# Patient Record
Sex: Female | Born: 1960 | Race: White | Hispanic: No | Marital: Single | Smoking: Never smoker
Health system: Southern US, Community
[De-identification: ages and names within clinical notes are randomized; demographics above are authoritative.]

## PROBLEM LIST (undated history)

## (undated) DIAGNOSIS — M810 Age-related osteoporosis without current pathological fracture: Secondary | ICD-10-CM

## (undated) DIAGNOSIS — I10 Essential (primary) hypertension: Secondary | ICD-10-CM

## (undated) DIAGNOSIS — G809 Cerebral palsy, unspecified: Secondary | ICD-10-CM

---

## 2001-07-20 ENCOUNTER — Encounter: Payer: Self-pay | Admitting: *Deleted

## 2001-07-20 ENCOUNTER — Encounter: Admission: RE | Admit: 2001-07-20 | Discharge: 2001-07-20 | Payer: Self-pay | Admitting: *Deleted

## 2002-12-26 ENCOUNTER — Encounter: Admission: RE | Admit: 2002-12-26 | Discharge: 2002-12-26 | Payer: Self-pay | Admitting: *Deleted

## 2003-01-02 ENCOUNTER — Other Ambulatory Visit: Admission: RE | Admit: 2003-01-02 | Discharge: 2003-01-02 | Payer: Self-pay | Admitting: *Deleted

## 2005-01-04 ENCOUNTER — Other Ambulatory Visit: Admission: RE | Admit: 2005-01-04 | Discharge: 2005-01-04 | Payer: Self-pay | Admitting: *Deleted

## 2005-01-10 ENCOUNTER — Encounter: Admission: RE | Admit: 2005-01-10 | Discharge: 2005-01-10 | Payer: Self-pay | Admitting: *Deleted

## 2006-09-25 ENCOUNTER — Encounter: Admission: RE | Admit: 2006-09-25 | Discharge: 2006-09-25 | Payer: Self-pay | Admitting: Family Medicine

## 2007-01-22 ENCOUNTER — Other Ambulatory Visit: Admission: RE | Admit: 2007-01-22 | Discharge: 2007-01-22 | Payer: Self-pay | Admitting: *Deleted

## 2007-08-30 ENCOUNTER — Encounter: Admission: RE | Admit: 2007-08-30 | Discharge: 2007-08-30 | Payer: Self-pay | Admitting: Family Medicine

## 2010-03-20 ENCOUNTER — Encounter: Payer: Self-pay | Admitting: *Deleted

## 2010-03-21 ENCOUNTER — Encounter: Payer: Self-pay | Admitting: Orthopaedic Surgery

## 2012-01-20 ENCOUNTER — Other Ambulatory Visit: Payer: Self-pay | Admitting: Gastroenterology

## 2012-01-20 DIAGNOSIS — Z139 Encounter for screening, unspecified: Secondary | ICD-10-CM

## 2012-02-09 ENCOUNTER — Ambulatory Visit
Admission: RE | Admit: 2012-02-09 | Discharge: 2012-02-09 | Disposition: A | Discharge status: Home / Self Care | Payer: PRIVATE HEALTH INSURANCE | Source: Ambulatory Visit | Attending: Gastroenterology | Admitting: Gastroenterology

## 2012-02-09 ENCOUNTER — Other Ambulatory Visit: Payer: Self-pay | Admitting: Gastroenterology

## 2012-02-09 DIAGNOSIS — Z139 Encounter for screening, unspecified: Secondary | ICD-10-CM

## 2012-03-06 ENCOUNTER — Other Ambulatory Visit: Payer: Self-pay | Admitting: Obstetrics and Gynecology

## 2012-03-06 ENCOUNTER — Other Ambulatory Visit (HOSPITAL_COMMUNITY)
Admission: RE | Admit: 2012-03-06 | Discharge: 2012-03-06 | Disposition: A | Discharge status: Home / Self Care | Payer: PRIVATE HEALTH INSURANCE | Source: Ambulatory Visit | Attending: Obstetrics and Gynecology | Admitting: Obstetrics and Gynecology

## 2012-03-06 DIAGNOSIS — Z01419 Encounter for gynecological examination (general) (routine) without abnormal findings: Secondary | ICD-10-CM | POA: Insufficient documentation

## 2012-03-19 ENCOUNTER — Other Ambulatory Visit: Payer: Self-pay | Admitting: Obstetrics and Gynecology

## 2012-03-19 DIAGNOSIS — R1900 Intra-abdominal and pelvic swelling, mass and lump, unspecified site: Secondary | ICD-10-CM

## 2012-03-22 ENCOUNTER — Ambulatory Visit
Admission: RE | Admit: 2012-03-22 | Discharge: 2012-03-22 | Disposition: A | Discharge status: Home / Self Care | Payer: PRIVATE HEALTH INSURANCE | Source: Ambulatory Visit | Attending: Obstetrics and Gynecology | Admitting: Obstetrics and Gynecology

## 2012-03-22 DIAGNOSIS — R1900 Intra-abdominal and pelvic swelling, mass and lump, unspecified site: Secondary | ICD-10-CM

## 2012-03-22 MED ORDER — IOHEXOL 300 MG/ML  SOLN
75.0000 mL | Freq: Once | INTRAMUSCULAR | Status: AC | PRN
  Administered 2012-03-22: 75 mL via INTRAVENOUS

## 2013-03-03 ENCOUNTER — Emergency Department (HOSPITAL_COMMUNITY)
Admission: EM | Admit: 2013-03-03 | Discharge: 2013-03-03 | Disposition: A | Discharge status: Home / Self Care | Payer: PRIVATE HEALTH INSURANCE | Attending: Emergency Medicine | Admitting: Emergency Medicine

## 2013-03-03 ENCOUNTER — Encounter (HOSPITAL_COMMUNITY): Payer: Self-pay | Admitting: Emergency Medicine

## 2013-03-03 DIAGNOSIS — IMO0002 Reserved for concepts with insufficient information to code with codable children: Secondary | ICD-10-CM | POA: Insufficient documentation

## 2013-03-03 DIAGNOSIS — W19XXXA Unspecified fall, initial encounter: Secondary | ICD-10-CM

## 2013-03-03 DIAGNOSIS — Y92410 Unspecified street and highway as the place of occurrence of the external cause: Secondary | ICD-10-CM | POA: Insufficient documentation

## 2013-03-03 DIAGNOSIS — Z79899 Other long term (current) drug therapy: Secondary | ICD-10-CM | POA: Insufficient documentation

## 2013-03-03 DIAGNOSIS — R296 Repeated falls: Secondary | ICD-10-CM | POA: Insufficient documentation

## 2013-03-03 DIAGNOSIS — M810 Age-related osteoporosis without current pathological fracture: Secondary | ICD-10-CM | POA: Insufficient documentation

## 2013-03-03 DIAGNOSIS — S0001XA Abrasion of scalp, initial encounter: Secondary | ICD-10-CM

## 2013-03-03 DIAGNOSIS — I10 Essential (primary) hypertension: Secondary | ICD-10-CM | POA: Insufficient documentation

## 2013-03-03 DIAGNOSIS — Y9389 Activity, other specified: Secondary | ICD-10-CM | POA: Insufficient documentation

## 2013-03-03 DIAGNOSIS — G809 Cerebral palsy, unspecified: Secondary | ICD-10-CM | POA: Insufficient documentation

## 2013-03-03 HISTORY — DX: Cerebral palsy, unspecified: G80.9

## 2013-03-03 HISTORY — DX: Age-related osteoporosis without current pathological fracture: M81.0

## 2013-03-03 HISTORY — DX: Essential (primary) hypertension: I10

## 2013-03-03 MED ORDER — TETANUS-DIPHTH-ACELL PERTUSSIS 5-2.5-18.5 LF-MCG/0.5 IM SUSP
0.5000 mL | Freq: Once | INTRAMUSCULAR | Status: AC
  Administered 2013-03-03: 0.5 mL via INTRAMUSCULAR
  Filled 2013-03-03: qty 0.5

## 2013-03-03 NOTE — ED Provider Notes (Signed)
CSN: [id number]     Arrival date & time 03/03/13  1016 History  This chart was scribed for Santiago Glad, PA working with Rolan Bucco, MD by Quintella Reichert, ED Scribe. This patient was seen in room TR10C/TR10C and the patient's care was started at 11:51 AM.   Chief Complaint  Patient presents with  . Fall  . Head Laceration    The history is provided by the patient and a relative. No language interpreter was used.    HPI Comments: Brianna Hicks is a 53 y.o. female with h/o cerebral palsy and osteoporosis who presents to the Emergency Department complaining of a head laceration sustained in a fall pta.  Pt was getting out of a car when her aunt accidentally opened the car door into her and pt fell and hit her head on concrete.  She denies LOC.  She is not on blood-thinners.  Pt now presents with a small wound to the top of her scalp.  Bleeding is controlled and she denies pain to the area.  She denies pain to any other areas including arms, legs, chest, back, neck, or abdomen.  She denies nausea, vomiting, visual changes, or any other associated symptoms.  She is unsure of her last tetanus vaccination.   Past Medical History  Diagnosis Date  . Cerebral palsy   . Hypertension   . Osteoporosis     History reviewed. No pertinent past surgical history.  History reviewed. No pertinent family history.   History  Substance Use Topics  . Smoking status: Not on file  . Smokeless tobacco: Not on file  . Alcohol Use: No    OB History   Grav Para Term Preterm Abortions TAB SAB Ect Mult Living                  Review of Systems  Eyes: Negative for visual disturbance.  Cardiovascular: Negative for chest pain.  Gastrointestinal: Negative for nausea, vomiting and abdominal pain.  Musculoskeletal: Negative for back pain and neck pain.  Skin: Positive for wound (small wound to top of head).  Neurological: Negative for syncope and headaches.     Allergies  Review of patient's  allergies indicates no known allergies.  Home Medications   Current Outpatient Rx  Name  Route  Sig  Dispense  Refill  . diazepam (VALIUM) 5 MG tablet   Oral   Take 5 mg by mouth every 6 (six) hours as needed for anxiety.         . sertraline (ZOLOFT) 100 MG tablet   Oral   Take 200 mg by mouth daily.         . triazolam (HALCION) 0.125 MG tablet   Oral   Take 0.125 mg by mouth at bedtime as needed for sleep.          BP 139/98  Pulse 86  Resp 18  SpO2 97%  Physical Exam  Nursing note and vitals reviewed. Constitutional: She appears well-developed and well-nourished.  HENT:  Head: Normocephalic.  Mouth/Throat: Oropharynx is clear and moist.  1-cm superficial abrasion of left frontal scalp, no active bleeding, no laceration  Eyes: EOM are normal. Pupils are equal, round, and reactive to light.  Neck: Normal range of motion. Neck supple.  Cardiovascular: Normal rate, regular rhythm and normal heart sounds.   Pulmonary/Chest: Effort normal and breath sounds normal. She has no wheezes.  Musculoskeletal: Normal range of motion.       Cervical back: She exhibits no bony  tenderness.       Thoracic back: She exhibits no bony tenderness.       Lumbar back: She exhibits no bony tenderness.  No spinal tenderness  Neurological: She is alert. She has normal strength. No cranial nerve deficit or sensory deficit.  Skin: Skin is warm and dry.  Psychiatric: She has a normal mood and affect. Her behavior is normal.    ED Course  Procedures (including critical care time)  DIAGNOSTIC STUDIES: Oxygen Saturation is 97% on room air, normal by my interpretation.    COORDINATION OF CARE: 11:58 AM-Informed pt and family that her wound does not require suturing.  Discussed treatment plan which includes tetanus booster and wound care.  Advised return precautions.  Pt and family expressed understanding and agreed with plan.   Labs Review Labs Reviewed - No data to display  Imaging  Review No results found.  EKG Interpretation   None       MDM  No diagnosis found. Patient presents today after a fall and an abrasion of the scalp.  No LOC.  Patient is currently not on any anticoagulants.  Normal neurological exam.  Therefore, do not feel that an imaging is indicated at this time.  Tetanus updated.  Patient stable for discharge.  Return precautions given.   I personally performed the services described in this documentation, which was scribed in my presence. The recorded information has been reviewed and is accurate.    Santiago GladHeather Darcy Cordner, PA-C 03/05/13 2223

## 2013-03-03 NOTE — ED Notes (Signed)
PA at bedside.

## 2013-03-03 NOTE — ED Notes (Signed)
Pt was getting out of car, the door closed and hit her, knocked her over and has small laceration to top of head, bleeding controlled. No loc, no blood thinners and unknown tetanus.

## 2013-03-05 NOTE — ED Provider Notes (Signed)
Medical screening examination/treatment/procedure(s) were performed by non-physician practitioner and as supervising physician I was immediately available for consultation/collaboration.  EKG Interpretation   None         Arnitra Sokoloski, MD 03/05/13 2302 

## 2013-12-27 ENCOUNTER — Ambulatory Visit
Admission: RE | Admit: 2013-12-27 | Discharge: 2013-12-27 | Disposition: A | Discharge status: Home / Self Care | Payer: Medicare Other | Source: Ambulatory Visit | Attending: Family Medicine | Admitting: Family Medicine

## 2013-12-27 ENCOUNTER — Other Ambulatory Visit: Payer: Self-pay | Admitting: Family Medicine

## 2013-12-27 DIAGNOSIS — M899 Disorder of bone, unspecified: Secondary | ICD-10-CM

## 2014-08-09 IMAGING — CR DG ABDOMEN 1V
1 series · 1 of 1 positions shown · non-contrast
Comparison: No priors.

CLINICAL DATA: Screening examination.  Patient unable to tolerate a
double contrast barium enema.

ABDOMEN - 1 VIEW
TECHNIQUE: After obtaining a scout radiograph air-contrast barium
enema was performed under fluoroscopy using high-density barium.

[view not recorded]
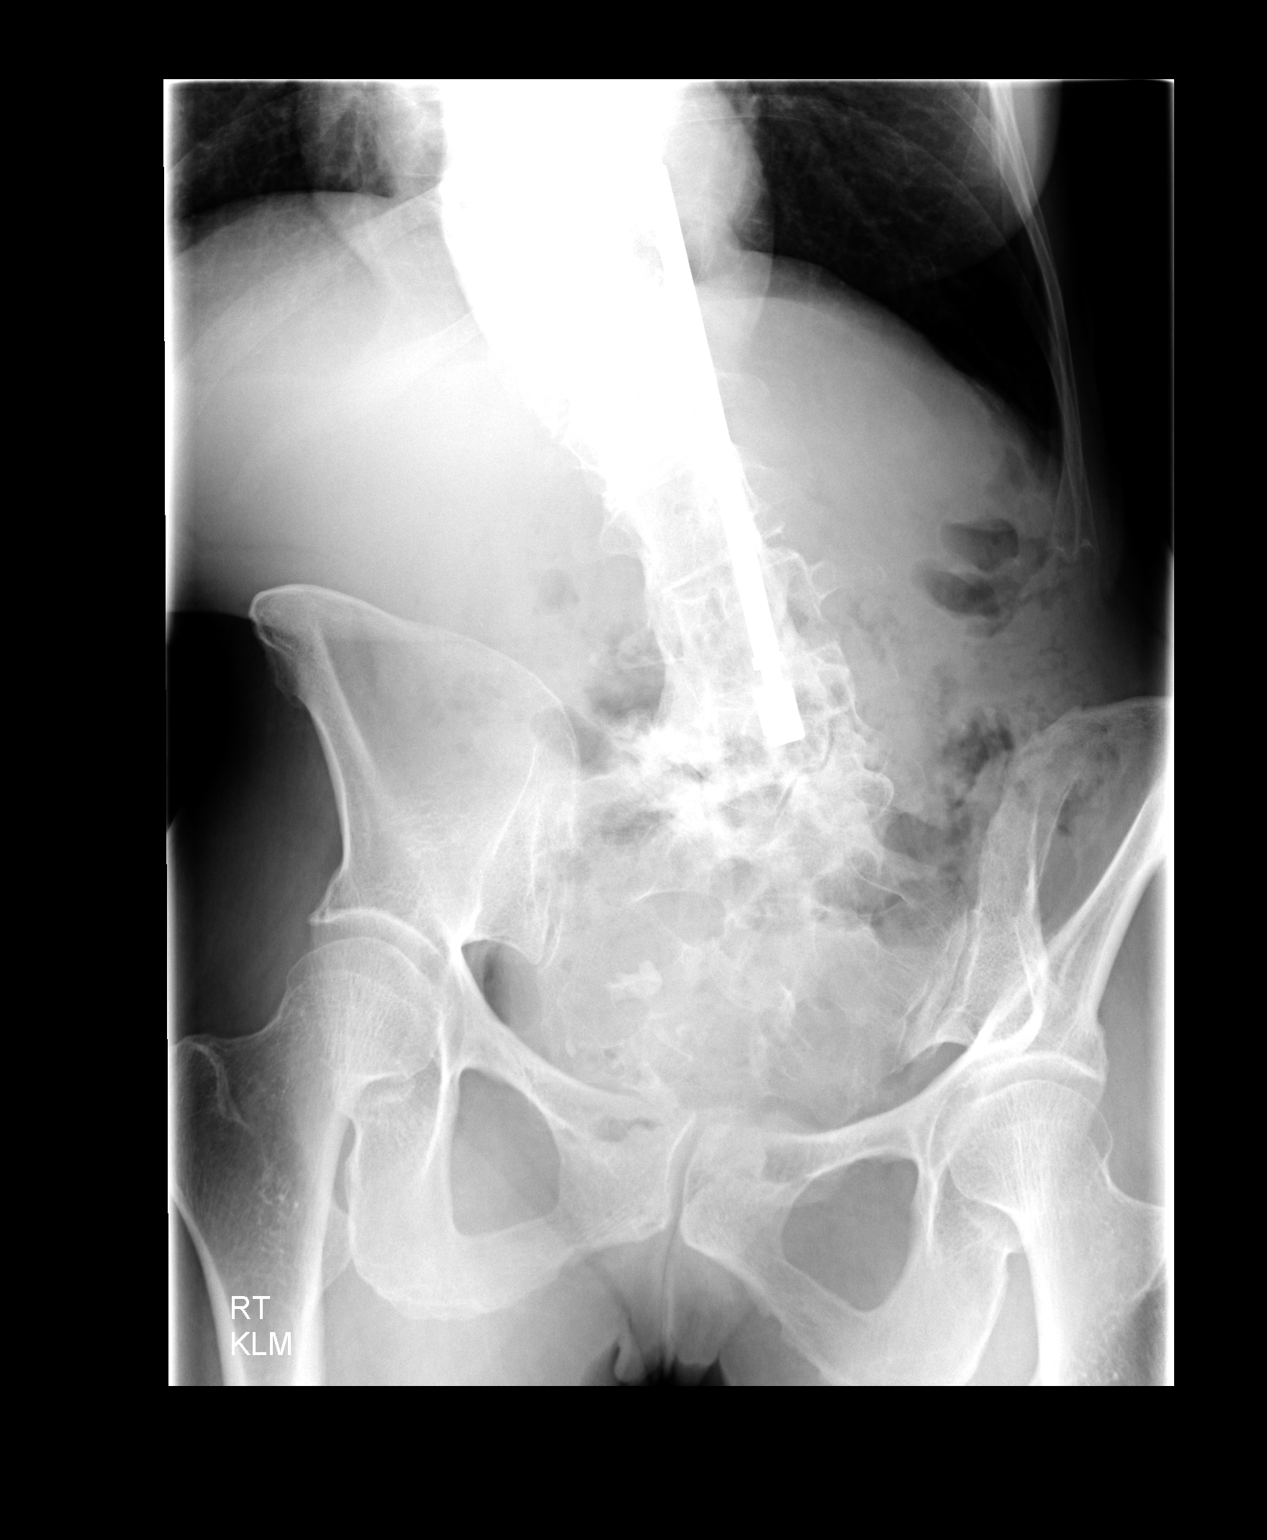

[1 of 1 positions shown; findings below may reference images not displayed]

FINDINGS: Single view of the abdomen demonstrates some gas and
stool scattered throughout the colon including the distal rectum.
No pathologic distension of small bowel.  No gross evidence of
pneumoperitoneum.  Severe S-shaped scoliosis of the thoracolumbar
spine convex to the right superiorly and to the left inferiorly is
noted, with spinal fixation rods (incompletely visualized).
IMPRESSION: 1.  Nonobstructive bowel gas pattern.  No pneumoperitoneum.
2.  Additional incidental findings, as above.

## 2019-09-30 ENCOUNTER — Ambulatory Visit (INDEPENDENT_AMBULATORY_CARE_PROVIDER_SITE_OTHER): Payer: Medicare Other | Admitting: Podiatry

## 2019-09-30 ENCOUNTER — Other Ambulatory Visit: Payer: Self-pay

## 2019-09-30 ENCOUNTER — Encounter: Payer: Self-pay | Admitting: Podiatry

## 2019-09-30 DIAGNOSIS — M216X9 Other acquired deformities of unspecified foot: Secondary | ICD-10-CM | POA: Diagnosis not present

## 2019-09-30 DIAGNOSIS — M2010 Hallux valgus (acquired), unspecified foot: Secondary | ICD-10-CM

## 2019-09-30 DIAGNOSIS — M2041 Other hammer toe(s) (acquired), right foot: Secondary | ICD-10-CM | POA: Insufficient documentation

## 2019-09-30 DIAGNOSIS — M2042 Other hammer toe(s) (acquired), left foot: Secondary | ICD-10-CM

## 2019-09-30 DIAGNOSIS — Q828 Other specified congenital malformations of skin: Secondary | ICD-10-CM

## 2019-09-30 NOTE — Progress Notes (Signed)
This patient presents the office with chief complaint of severely painful calluses on both feet.  Is brought to the office by a female caregiver.  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  This patient states that the corn on the fifth toe of the right foot has been extremely painful for 2 weeks and she presents the office for treatment.  She also says that she has multiple callus on the bottom of both of her forefeet which are painful when walking.  She presents the office today for an evaluation and treatment of her painful callus.  Vascular  Dorsalis pedis  are palpable  B/L. Posterior tibial pulses are absent  B/L. Capillary return  WNL.  Temperature gradient is  WNL.  Skin turgor  WNL  Sensorium  deferred  Nail Exam  Patient has normal nails with no evidence of bacterial or fungal infection.  Orthopedic  Exam  Muscle tone and muscle strength  WNL.  No limitations of motion feet  B/L.  No crepitus or joint effusion noted.  HAV  B/L.  Hammer toes  B/L.  Prominent metatarsal heads  B/L.  Plantarflexed fifth metatarsals  B/L.Marland Kitchen  Skin  No open lesions.  Normal skin texture and turgor. Porokeratosis sub 2 right foot.  Heloma durum fifth toe right foot.  Porokeratosis sub 5  B/L  Porokeratosis/callus  Forefeet  B/L.    IE>  Debride multiple callus  B/L with # 15 blade.  RTC 10 weeks.  Helane Gunther DPM

## 2019-10-09 ENCOUNTER — Ambulatory Visit: Payer: Medicare Other | Admitting: Orthotics

## 2019-10-09 ENCOUNTER — Other Ambulatory Visit: Payer: Self-pay

## 2019-10-09 DIAGNOSIS — M2041 Other hammer toe(s) (acquired), right foot: Secondary | ICD-10-CM

## 2019-10-09 DIAGNOSIS — M216X9 Other acquired deformities of unspecified foot: Secondary | ICD-10-CM

## 2019-10-09 DIAGNOSIS — Q828 Other specified congenital malformations of skin: Secondary | ICD-10-CM

## 2019-10-09 NOTE — Progress Notes (Signed)
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  Self pay $250

## 2019-10-10 ENCOUNTER — Telehealth: Payer: Self-pay | Admitting: Podiatry

## 2019-10-10 NOTE — Telephone Encounter (Signed)
Pts aunt Pollie Friar called and has discussed the shoes with the poa her nephew and he said it was ok to proceed with the shoes.

## 2019-10-21 ENCOUNTER — Telehealth: Payer: Self-pay | Admitting: Podiatry

## 2019-10-21 NOTE — Telephone Encounter (Signed)
Received voicemail from Pollie Friar per patient asking for status of shoes they ordered.   I returned call and told her the inserts are being made as we speak and they would call when they come in to schedule pt to pick them up.Marland KitchenMarland Kitchen

## 2019-11-11 ENCOUNTER — Telehealth: Payer: Self-pay | Admitting: Podiatry

## 2019-11-11 NOTE — Telephone Encounter (Signed)
Received voicemail from [facility] checking on pts shoes that were ordered.  They were sent to [place] office for pt to be called to schedule an appt.

## 2019-11-27 ENCOUNTER — Ambulatory Visit (INDEPENDENT_AMBULATORY_CARE_PROVIDER_SITE_OTHER): Payer: Medicare Other | Admitting: Orthotics

## 2019-11-27 ENCOUNTER — Other Ambulatory Visit: Payer: Self-pay

## 2019-11-27 DIAGNOSIS — M2041 Other hammer toe(s) (acquired), right foot: Secondary | ICD-10-CM | POA: Diagnosis not present

## 2019-11-27 DIAGNOSIS — M2042 Other hammer toe(s) (acquired), left foot: Secondary | ICD-10-CM

## 2019-11-27 DIAGNOSIS — Q828 Other specified congenital malformations of skin: Secondary | ICD-10-CM

## 2019-11-27 NOTE — Progress Notes (Signed)
Picked up [employer];

## 2019-12-19 ENCOUNTER — Encounter: Payer: Self-pay | Admitting: Podiatry

## 2019-12-19 ENCOUNTER — Other Ambulatory Visit: Payer: Self-pay

## 2019-12-19 ENCOUNTER — Ambulatory Visit (INDEPENDENT_AMBULATORY_CARE_PROVIDER_SITE_OTHER): Payer: Medicare Other | Admitting: Podiatry

## 2019-12-19 DIAGNOSIS — M216X9 Other acquired deformities of unspecified foot: Secondary | ICD-10-CM

## 2019-12-19 DIAGNOSIS — Q828 Other specified congenital malformations of skin: Secondary | ICD-10-CM

## 2019-12-19 DIAGNOSIS — M2010 Hallux valgus (acquired), unspecified foot: Secondary | ICD-10-CM | POA: Diagnosis not present

## 2019-12-19 NOTE — Progress Notes (Addendum)
This patient presents the office with chief complaint of severely painful calluses on both feet.  She  Is brought to the office by a female caregiver.  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    She also says that she has multiple callus on the bottom of both of her forefeet which are painful when walking.  She presents the office today for an evaluation and treatment of her painful callus. Patient has received her new shoes thrr weeks ago and her feet are better.  Vascular  Dorsalis pedis  are palpable  B/L. Posterior tibial pulses are absent  B/L. Capillary return  WNL.  Temperature gradient is  WNL.  Skin turgor  WNL  Sensorium  deferred  Nail Exam  Patient has normal nails with no evidence of bacterial or fungal infection.  Orthopedic  Exam  Muscle tone and muscle strength  WNL.  No limitations of motion feet  B/L.  No crepitus or joint effusion noted.  HAV  B/L.  Hammer toes  B/L.  Prominent metatarsal heads  B/L.  Plantarflexed fifth metatarsals  B/L.Marland Kitchen  Skin  No open lesions.  Normal skin texture and turgor. Porokeratosis sub 2 right foot.    Porokeratosis sub 5  B/L  Porokeratosis/callus  Forefeet  B/L.      Debride multiple callus  B/L with # 15 blade.  RTC 10 weeks.  Helane Gunther DPM

## 2020-02-27 ENCOUNTER — Encounter: Payer: Self-pay | Admitting: Podiatry

## 2020-02-27 ENCOUNTER — Ambulatory Visit (INDEPENDENT_AMBULATORY_CARE_PROVIDER_SITE_OTHER): Payer: Medicare Other | Admitting: Podiatry

## 2020-02-27 ENCOUNTER — Other Ambulatory Visit: Payer: Self-pay

## 2020-02-27 DIAGNOSIS — F39 Unspecified mood [affective] disorder: Secondary | ICD-10-CM | POA: Insufficient documentation

## 2020-02-27 DIAGNOSIS — L84 Corns and callosities: Secondary | ICD-10-CM

## 2020-02-27 DIAGNOSIS — M2042 Other hammer toe(s) (acquired), left foot: Secondary | ICD-10-CM

## 2020-02-27 DIAGNOSIS — M2041 Other hammer toe(s) (acquired), right foot: Secondary | ICD-10-CM | POA: Diagnosis not present

## 2020-02-27 DIAGNOSIS — M810 Age-related osteoporosis without current pathological fracture: Secondary | ICD-10-CM | POA: Insufficient documentation

## 2020-02-27 DIAGNOSIS — K589 Irritable bowel syndrome without diarrhea: Secondary | ICD-10-CM | POA: Insufficient documentation

## 2020-02-27 DIAGNOSIS — G809 Cerebral palsy, unspecified: Secondary | ICD-10-CM | POA: Diagnosis not present

## 2020-02-27 DIAGNOSIS — I10 Essential (primary) hypertension: Secondary | ICD-10-CM | POA: Insufficient documentation

## 2020-02-27 DIAGNOSIS — Q828 Other specified congenital malformations of skin: Secondary | ICD-10-CM

## 2020-02-27 DIAGNOSIS — M549 Dorsalgia, unspecified: Secondary | ICD-10-CM | POA: Insufficient documentation

## 2020-02-27 DIAGNOSIS — K5900 Constipation, unspecified: Secondary | ICD-10-CM | POA: Insufficient documentation

## 2020-02-27 DIAGNOSIS — M419 Scoliosis, unspecified: Secondary | ICD-10-CM | POA: Insufficient documentation

## 2020-02-27 DIAGNOSIS — G8929 Other chronic pain: Secondary | ICD-10-CM | POA: Insufficient documentation

## 2020-02-27 NOTE — Progress Notes (Signed)
This patient presents the office with chief complaint of severely painful calluses on both feet.  She  Is brought to the office by a female caregiver.  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    She also says that she has multiple callus on the bottom of both of her forefeet which are painful when walking.  She presents the office today for an evaluation and treatment of her painful callus.   Vascular  Dorsalis pedis  are palpable  B/L. Posterior tibial pulses are absent  B/L. Capillary return  WNL.  Temperature gradient is  WNL.  Skin turgor  WNL  Sensorium  deferred  Nail Exam  Patient has normal nails with no evidence of bacterial or fungal infection.  Orthopedic  Exam  Muscle tone and muscle strength  WNL.  No limitations of motion feet  B/L.  No crepitus or joint effusion noted.  HAV  B/L.  Hammer toes  B/L.  Prominent metatarsal heads  B/L.  Plantarflexed fifth metatarsals  B/L.Marland Kitchen  Skin  No open lesions.  Normal skin texture and turgor. Heloma durum fifth toe right foot.  Porokeratosis sub 5th  Right.   Porokeratosis sub 3 right  Porokeratosis/callus  Forefeet  B/L.      Debride multiple callus  B/L with # 15 blade.  RTC 10 weeks.  Helane Gunther DPM

## 2020-05-07 ENCOUNTER — Ambulatory Visit (INDEPENDENT_AMBULATORY_CARE_PROVIDER_SITE_OTHER): Payer: Medicare Other | Admitting: Podiatry

## 2020-05-07 ENCOUNTER — Encounter: Payer: Self-pay | Admitting: Podiatry

## 2020-05-07 ENCOUNTER — Other Ambulatory Visit: Payer: Self-pay

## 2020-05-07 DIAGNOSIS — L84 Corns and callosities: Secondary | ICD-10-CM | POA: Diagnosis not present

## 2020-05-07 DIAGNOSIS — M2041 Other hammer toe(s) (acquired), right foot: Secondary | ICD-10-CM | POA: Diagnosis not present

## 2020-05-07 DIAGNOSIS — G809 Cerebral palsy, unspecified: Secondary | ICD-10-CM | POA: Diagnosis not present

## 2020-05-07 DIAGNOSIS — M2010 Hallux valgus (acquired), unspecified foot: Secondary | ICD-10-CM

## 2020-05-07 DIAGNOSIS — Q828 Other specified congenital malformations of skin: Secondary | ICD-10-CM | POA: Diagnosis not present

## 2020-05-07 DIAGNOSIS — M216X9 Other acquired deformities of unspecified foot: Secondary | ICD-10-CM

## 2020-05-07 DIAGNOSIS — M2042 Other hammer toe(s) (acquired), left foot: Secondary | ICD-10-CM

## 2020-05-07 NOTE — Progress Notes (Signed)
This patient presents the office with chief complaint of severely painful calluses on both feet.  She  Is brought to the office by a female caregiver.  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    She also says that she has multiple callus on the bottom of both of her forefeet which are painful when walking.  She presents the office today for an evaluation and treatment of her painful callus. She presents to the office with her guardian.  Vascular  Dorsalis pedis  are palpable  B/L. Posterior tibial pulses are absent  B/L. Capillary return  WNL.  Temperature gradient is  WNL.  Skin turgor  WNL  Sensorium  deferred  Nail Exam  Patient has normal nails with no evidence of bacterial or fungal infection.  Orthopedic  Exam  Muscle tone and muscle strength  WNL.  No limitations of motion feet  B/L.  No crepitus or joint effusion noted.  HAV  B/L.  Hammer toes  B/L.  Prominent metatarsal heads  B/L.  Plantarflexed fifth metatarsals  B/L.Marland Kitchen  Skin  No open lesions.  Normal skin texture and turgor. Heloma durum fifth toe right foot.  Porokeratosis sub 5th  left .   Porokeratosis sub 3 right  Porokeratosis/callus  Forefeet  B/L.      Debride multiple callus  B/L with # 15 blade.  RTC 10 weeks.  Helane Gunther DPM

## 2020-07-16 ENCOUNTER — Ambulatory Visit: Payer: Medicare Other | Admitting: Podiatry

## 2020-08-03 ENCOUNTER — Ambulatory Visit: Payer: Medicare Other | Admitting: Podiatry

## 2020-08-17 ENCOUNTER — Ambulatory Visit: Payer: Medicare Other | Admitting: Podiatry

## 2022-01-24 ENCOUNTER — Emergency Department (HOSPITAL_COMMUNITY)
Admission: EM | Admit: 2022-01-24 | Discharge: 2022-01-24 | Disposition: A | Discharge status: Home / Self Care | Payer: Medicare Other | Attending: Emergency Medicine | Admitting: Emergency Medicine

## 2022-01-24 ENCOUNTER — Encounter (HOSPITAL_COMMUNITY): Payer: Self-pay

## 2022-01-24 ENCOUNTER — Other Ambulatory Visit: Payer: Self-pay

## 2022-01-24 DIAGNOSIS — I10 Essential (primary) hypertension: Secondary | ICD-10-CM | POA: Diagnosis not present

## 2022-01-24 DIAGNOSIS — R531 Weakness: Secondary | ICD-10-CM

## 2022-01-24 DIAGNOSIS — Z79899 Other long term (current) drug therapy: Secondary | ICD-10-CM | POA: Insufficient documentation

## 2022-01-24 DIAGNOSIS — D649 Anemia, unspecified: Secondary | ICD-10-CM | POA: Diagnosis not present

## 2022-01-24 LAB — COMPREHENSIVE METABOLIC PANEL
ALT: 13 U/L (ref 0–44)
AST: 26 U/L (ref 15–41)
Albumin: 3.9 g/dL (ref 3.5–5.0)
Alkaline Phosphatase: 72 U/L (ref 38–126)
Anion gap: 12 (ref 5–15)
BUN: 16 mg/dL (ref 6–20)
CO2: 26 mmol/L (ref 22–32)
Calcium: 9.3 mg/dL (ref 8.9–10.3)
Chloride: 100 mmol/L (ref 98–111)
Creatinine, Ser: 0.75 mg/dL (ref 0.44–1.00)
GFR, Estimated: >60 mL/min (ref >60)
Glucose, Bld: 91 mg/dL (ref 70–99)
Potassium: 3.2 mmol/L — ABNORMAL LOW (ref 3.5–5.1)
Sodium: 138 mmol/L (ref 135–145)
Total Bilirubin: 0.5 mg/dL (ref 0.3–1.2)
Total Protein: 6.8 g/dL (ref 6.5–8.1)

## 2022-01-24 LAB — CBC WITH DIFFERENTIAL/PLATELET
Abs Immature Granulocytes: 0.03 10*3/uL (ref 0.00–0.07)
Basophils Absolute: 0 10*3/uL (ref 0.0–0.1)
Basophils Relative: 0 %
Eosinophils Absolute: 0 10*3/uL (ref 0.0–0.5)
Eosinophils Relative: 0 %
HCT: 33.9 % — ABNORMAL LOW (ref 36.0–46.0)
Hemoglobin: 10.6 g/dL — ABNORMAL LOW (ref 12.0–15.0)
Immature Granulocytes: 0 %
Lymphocytes Relative: 14 %
Lymphs Abs: 1.5 10*3/uL (ref 0.7–4.0)
MCH: 23.8 pg — ABNORMAL LOW (ref 26.0–34.0)
MCHC: 31.3 g/dL (ref 30.0–36.0)
MCV: 76 fL — ABNORMAL LOW (ref 80.0–100.0)
Monocytes Absolute: 0.7 10*3/uL (ref 0.1–1.0)
Monocytes Relative: 7 %
Neutro Abs: 8.5 10*3/uL — ABNORMAL HIGH (ref 1.7–7.7)
Neutrophils Relative %: 79 %
Platelets: 379 10*3/uL (ref 150–400)
RBC: 4.46 MIL/uL (ref 3.87–5.11)
RDW: 18.6 % — ABNORMAL HIGH (ref 11.5–15.5)
WBC: 10.7 10*3/uL — ABNORMAL HIGH (ref 4.0–10.5)
nRBC: 0 % (ref 0.0–0.2)

## 2022-01-24 LAB — URINALYSIS, ROUTINE W REFLEX MICROSCOPIC
Bilirubin Urine: NEGATIVE
Glucose, UA: NEGATIVE mg/dL
Hgb urine dipstick: NEGATIVE
Ketones, ur: NEGATIVE mg/dL
Leukocytes,Ua: NEGATIVE
Nitrite: NEGATIVE
Protein, ur: NEGATIVE mg/dL
Specific Gravity, Urine: 1.02 (ref 1.005–1.030)
pH: 6.5 (ref 5.0–8.0)

## 2022-01-24 NOTE — Progress Notes (Signed)
TOC CSW spoke with pts nephew, Georgette Shell [phone] about pt being placed into rehab.  Ivin Booty currently in possession of a FL2 that was given to him by PCP.  CSW explained to [place] pts co-ins days. For the days 1-20 the cost would be a max of $20 per day, 21-100 the cost would be a max of $196 per day.  The co-pay days are 10% per day.  Ivin Booty stated pt was recently approved for Medicaid as well.  Ivin Booty confirmed understanding, and stated he would began searching for placement tomorrow.  Vivianne Carles Tarpley-Carter, MSW, LCSW-A Pronouns:  She/Her/Hers [facility] HealthTransitions of Care Clinical Social Worker Direct Number:  [phone] [email or web address]

## 2022-01-24 NOTE — ED Provider Notes (Signed)
[facility] EMERGENCY DEPARTMENT Provider Note   CSN: [id number] Arrival date & time: 01/24/22  1031     History  Chief Complaint  Patient presents with   Weakness    DEMAYA Hicks is a 61 y.o. female.   Weakness  This patient is a 61 year old female tension with hypertension on hydrochlorothiazide, she has chronic cerebral palsy, lifelong.  She has been living by herself since 1970 and still lives by herself.  She is accompanied by her nephew today who states that he has been trying to care for her but has noticed that she has been increasingly weak has had a couple of falls this week has been dropping things with her hands which are gradually becoming more and more weak and is having significant difficulty living by herself.  When he contacted her family doctor, Dr. Serita Grammes she recommended that the patient be brought to the hospital for evaluation and placement.  When he contacted adult protective services he was told the same thing.  Thus he brought her to the hospital today for potential placement in a nursing facility.  The patient has not had any fevers or chills or nausea or vomiting or diarrhea, she is not coughing or short of breath.  She is only weighs 70 pounds and has had significant failure to thrive but this has been over time.  She eats very little, she continues to lose weight, she does not have any physical complaints at this time    Home Medications Prior to Admission medications   Medication Sig Start Date End Date Taking? Authorizing Provider  dicyclomine (BENTYL) 10 MG capsule Take 10 mg by mouth at bedtime. 06/23/19   [provider]  gabapentin (NEURONTIN) 100 MG capsule Take 100 mg by mouth 3 (three) times daily.    [provider]  hydrochlorothiazide (HYDRODIURIL) 25 MG tablet Take 25 mg by mouth daily. 07/09/19   [provider]  Multiple Vitamins-Minerals (VITAMIN D3 COMPLETE PO) Take by mouth.    [provider]  raloxifene (EVISTA) 60 MG tablet Take 60 mg by mouth daily. 06/23/19   [provider]  VITAMIN D PO 1 tablet    [provider]      Allergies    Patient has no known allergies.    Review of Systems   Review of Systems  Neurological:  Positive for weakness.  All other systems reviewed and are negative.   Physical Exam Updated Vital Signs BP 108/66   Pulse 94   Temp 98.6 F (37 C)   Resp 16   Ht 1.499 m (4\' 11" )   Wt 31.8 kg   SpO2 96%   BMI 14.14 kg/m  Physical Exam Vitals and nursing note reviewed.  Constitutional:      General: She is not in acute distress.    Appearance: She is well-developed.  HENT:     Head: Normocephalic and atraumatic.     Mouth/Throat:     Mouth: Mucous membranes are moist.     Pharynx: No oropharyngeal exudate.  Eyes:     General: No scleral icterus.       Right eye: No discharge.        Left eye: No discharge.     Conjunctiva/sclera: Conjunctivae normal.     Pupils: Pupils are equal, round, and reactive to light.  Neck:     Thyroid: No thyromegaly.     Vascular: No JVD.     Comments: Severely kyphotic  Cardiovascular:     Rate and Rhythm: Normal rate and regular rhythm.     Heart sounds: Normal heart sounds. No murmur heard.    No friction rub. No gallop.  Pulmonary:     Effort: Pulmonary effort is normal. No respiratory distress.     Breath sounds: Normal breath sounds. No wheezing or rales.  Abdominal:     General: Bowel sounds are normal. There is no distension.     Palpations: Abdomen is soft. There is no mass.     Tenderness: There is no abdominal tenderness.  Musculoskeletal:        General: No swelling, tenderness, deformity or signs of injury. Normal range of motion.     Cervical back: Normal range of motion and neck supple.     Right lower leg: No edema.     Left lower leg: No edema.     Comments: The patient is able to move all 4 extremities with normal range of motion, the compartments  are diffusely soft in the joints are supple  Lymphadenopathy:     Cervical: No cervical adenopathy.  Skin:    General: Skin is warm and dry.     Findings: No erythema or rash.  Neurological:     General: No focal deficit present.     Mental Status: She is alert.     Coordination: Coordination normal.  Psychiatric:        Behavior: Behavior normal.     ED Results / Procedures / Treatments   Labs (all labs ordered are listed, but only abnormal results are displayed) Labs Reviewed  COMPREHENSIVE METABOLIC PANEL - Abnormal; Notable for the following components:      Result Value   Potassium 3.2 (*)    All other components within normal limits  CBC WITH DIFFERENTIAL/PLATELET - Abnormal; Notable for the following components:   WBC 10.7 (*)    Hemoglobin 10.6 (*)    HCT 33.9 (*)    MCV 76.0 (*)    MCH 23.8 (*)    RDW 18.6 (*)    Neutro Abs 8.5 (*)    All other components within normal limits  URINALYSIS, ROUTINE W REFLEX MICROSCOPIC    EKG EKG Interpretation  Date/Time:  Monday January 24 2022 11:42:56 EST Ventricular Rate:  91 PR Interval:  106 QRS Duration: 80 QT Interval:  356 QTC Calculation: 437 R Axis:   48 Text Interpretation: Sinus rhythm with short PR Left ventricular hypertrophy with repolarization abnormality ( Sokolow-Lyon ) Abnormal ECG No previous ECGs available Confirmed by Eber Hong ([id number]) on 01/24/2022 5:30:50 PM  Radiology No results found.  Procedures Procedures    Medications Ordered in ED Medications - No data to display  ED Course/ Medical Decision Making/ A&P                           Medical Decision Making  This patient presents to the ED for concern of progressive weakness and weight loss, the patient's family member was told that she was likely malnourished and needed to be placed in a nursing facility, this involves an extensive number of treatment options, and is a complaint that carries with it a high risk of complications and  morbidity.  The differential diagnosis includes renal dysfunction, electrolyte abnormalities, dehydration, less likely to be infection, more likely to be chronic debilitating wasting disease   Co morbidities that complicate the patient evaluation  Hypertension, cerebral palsy   Additional history  obtained:  Additional history obtained from family member in the medical record External records from outside source obtained and reviewed including multiple podiatry visit follow-ups, no recent admissions to the hospital   Lab Tests:  I Ordered, and personally interpreted labs.  The pertinent results include: CBC and metabolic panel otherwise unremarkable, mild anemia  Imaging not indicated, no signs of fractures or dislocations of the hips or the arms on exam, no tenderness over the head, no signs of head injury.   Cardiac Monitoring: / EKG:  The patient was maintained on a cardiac monitor.  I personally viewed and interpreted the cardiac monitored which showed an underlying rhythm of: Normal sinus rhythm   Consultations Obtained:  I requested consultation with the transition of care and social work team,  and discussed lab and imaging findings as well as pertinent plan - they recommend: Outpatient follow-up and placement.  The social worker did speak with the family and tells me that they are okay with the plan.  The patient is stable for discharge based on labs   Problem List / ED Course / Critical interventions / Medication management  Labs, vital signs, urinalysis all reveal the patient is in no distress and has no significant signs of dehydration or severe malnutrition. I have reviewed the patients home medicines and have made adjustments as needed   Social Determinants of Health:  Generally weak, lives by herself   Test / Admission - Considered:  Considered admission but there is no objective findings to suggest the need for inpatient admission and stabilizing  care         Final Clinical Impression(s) / ED Diagnoses Final diagnoses:  Generalized weakness    Rx / DC Orders ED Discharge Orders     None         Noemi Chapel, MD 01/24/22 [place]

## 2022-01-24 NOTE — ED Provider Triage Note (Signed)
Emergency Medicine Provider Triage Evaluation Note  Brianna Hicks , a 61 y.o. female  was evaluated in triage.  Pt complains of generalized weakness.  Her guardian states that this has been going on for couple weeks.  She was evaluated by her PCP for this complaint who advised that she should be admitted to nursing home.  As the PCP was concern for malnutrition.  Guardian also states that she is fallen a couple times in the last month.  Also mentioned that she is generally more weak and having difficulty walking and holding onto things.  Denies chest pain, headache and abdominal pain and shortness of breath.  Guardian states that he was advised by Adult Protective Services and PCP that coming to the ED would be the best way to precipitate the process of finding placement for her in a nursing facility.  Review of Systems  Positive: See above Negative: See above  Physical Exam  BP 123/74 (BP Location: Left Arm)   Pulse 93   Temp 99 F (37.2 C) (Oral)   Resp 18   Ht 4\' 11"  (1.499 m)   Wt 31.8 kg   SpO2 97%   BMI 14.14 kg/m  Gen:   Awake, no distress   Resp:  Normal effort  MSK:   Moves extremities without difficulty  Other:    Medical Decision Making  Medically screening exam initiated at 11:35 AM.  Appropriate orders placed.  was informed that the remainder of the evaluation will be completed by another provider, this initial triage assessment does not replace that evaluation, and the importance of remaining in the ED until their evaluation is complete.  Work up Jefferey Pica, [employer], PA-C 01/24/22 1137

## 2022-01-24 NOTE — ED Triage Notes (Signed)
Pt arrived POV with family stating they need to get her into a nursing home but cannot afford it so there primary care told them to come here because she is anemic and malnourished.

## 2022-01-24 NOTE — Discharge Instructions (Addendum)
I have asked our social worker to look over your case, they state that there is no way to place Brianna Hicks in a nursing facility from the ER, all of the testing was actually very reassuring showing minimal anemia and no significant signs of dehydration or malnutrition.  Ultimately she does need to try to eat and drink more and she should not be living by herself, at this time she will need to have a family member staying with her and I will ask for home health services to contact you to add some additional assistance.  Please be in close contact with her doctor to follow-up with nursing home placement

## 2023-06-29 DEATH — deceased
